# Patient Record
Sex: Male | Born: 1970 | Race: White | Hispanic: No | Marital: Married | Smoking: Never smoker
Health system: Southern US, Community
[De-identification: ages and names within clinical notes are randomized; demographics above are authoritative.]

## PROBLEM LIST (undated history)

## (undated) HISTORY — PX: OTHER SURGICAL HISTORY: SHX169

## (undated) HISTORY — PX: CHOLECYSTECTOMY: SHX55

## (undated) HISTORY — PX: TONSILLECTOMY: SUR1361

---

## 2016-07-20 ENCOUNTER — Emergency Department (HOSPITAL_COMMUNITY)
Admission: EM | Admit: 2016-07-20 | Discharge: 2016-07-21 | Disposition: A | Discharge status: Home / Self Care | Payer: 59 | Attending: Emergency Medicine | Admitting: Emergency Medicine

## 2016-07-20 ENCOUNTER — Encounter (HOSPITAL_COMMUNITY): Payer: Self-pay | Admitting: Emergency Medicine

## 2016-07-20 ENCOUNTER — Emergency Department (HOSPITAL_COMMUNITY): Payer: 59

## 2016-07-20 DIAGNOSIS — Y999 Unspecified external cause status: Secondary | ICD-10-CM | POA: Diagnosis not present

## 2016-07-20 DIAGNOSIS — Y93F2 Activity, caregiving, lifting: Secondary | ICD-10-CM | POA: Insufficient documentation

## 2016-07-20 DIAGNOSIS — M25572 Pain in left ankle and joints of left foot: Secondary | ICD-10-CM

## 2016-07-20 DIAGNOSIS — X501XXA Overexertion from prolonged static or awkward postures, initial encounter: Secondary | ICD-10-CM | POA: Diagnosis not present

## 2016-07-20 DIAGNOSIS — Y929 Unspecified place or not applicable: Secondary | ICD-10-CM | POA: Diagnosis not present

## 2016-07-20 MED ORDER — IBUPROFEN 400 MG PO TABS
600.0000 mg | ORAL_TABLET | Freq: Once | ORAL | Status: AC
  Administered 2016-07-21: 600 mg via ORAL
  Filled 2016-07-20: qty 2

## 2016-07-20 MED ORDER — OXYCODONE-ACETAMINOPHEN 5-325 MG PO TABS
2.0000 | ORAL_TABLET | Freq: Once | ORAL | Status: AC
  Administered 2016-07-21: 2 via ORAL
  Filled 2016-07-20: qty 2

## 2016-07-20 NOTE — ED Triage Notes (Signed)
Pt states he had squatted down to pick something up and felt a pop in his L leg just above the ankle. Pt reports throbbing pain and difficulty walking.

## 2016-07-20 NOTE — ED Provider Notes (Signed)
[facility]-EM[facility]Y DEPT Provider Note   CSN: [id number] Arrival date & time: 07/20/16  1941  By signing my name below, I, Linna Darner, attest that this documentation has been prepared under the direction and in the presence of physician practitioner, Azalia Bilis, MD. Electronically Signed: Linna Darner, Scribe. 07/20/2016. 11:42 PM.  History   Chief Complaint Chief Complaint  Patient presents with  . Leg Pain    The history is provided by the patient. No language interpreter was used.     HPI Comments: Jay Morgan is a 45 y.o. male who presents to the Emergency Department complaining of sudden onset, constant, left lower leg pain beginning shortly PTA. Pt reports he squatted down to lift a heavy item and heard a popping sensation in his left lower leg right above his ankle. Pt notes some bruising and swelling to the area. He endorses shooting pain into his left ankle with left knee flexion. He reports he is able to wiggle his left toes. He has not tried any medications or treatments for pain PTA. He notes he is from [place] but is in town temporarily for work. Pt denies left knee pain, numbness, neuro deficits, or any other associated symptoms.  History reviewed. No pertinent past medical history.  There are no active problems to display for this patient.   Past Surgical History:  Procedure Laterality Date  . CHOLECYSTECTOMY    . TONSILLECTOMY    . tubes in ears         Home Medications    Prior to Admission medications   Not on File    Family History Family History  Problem Relation Age of Onset  . Hypertension Father   . Diabetes Other   . Hypertension Other     Social History Social History  Substance Use Topics  . Smoking status: Never Smoker  . Smokeless tobacco: Never Used  . Alcohol use No     Allergies   Review of patient's allergies indicates no known allergies.   Review of Systems Review of Systems  A complete 10 system review of systems  was obtained and all systems are negative except as noted in the HPI and PMH.   Physical Exam Updated Vital Signs BP 142/80 (BP Location: Left Arm)   Pulse 70   Temp 99 F (37.2 C) (Oral)   Resp 20   Ht 5\' 9"  (1.753 m)   Wt 240 lb (108.9 kg)   SpO2 100%   BMI 35.44 kg/m   Physical Exam  Constitutional: He is oriented to person, place, and time. He appears well-developed and well-nourished.  HENT:  Head: Normocephalic.  Eyes: EOM are normal.  Neck: Normal range of motion.  Pulmonary/Chest: Effort normal.  Abdominal: He exhibits no distension.  Musculoskeletal: Normal range of motion.  Painful ROM of left ankle with tenderness over the distal anterior tibia. Normal PT and DP pulse in left foot. Compartments of left foot are soft. Small associated swelling of the dorsum of the left foot.   Neurological: He is alert and oriented to person, place, and time.  Psychiatric: He has a normal mood and affect.  Nursing note and vitals reviewed.   ED Treatments / Results  Labs (all labs ordered are listed, but only abnormal results are displayed) Labs Reviewed - No data to display  EKG  EKG Interpretation None       Radiology Dg Tibia/fibula Left  Result Date: 07/20/2016 CLINICAL DATA:  The patient felt a pop with onset of pain  in the left leg just above the ankle when squatting to pick something up today. Initial encounter. EXAM: LEFT TIBIA AND FIBULA - 2 VIEW COMPARISON:  None. FINDINGS: There is no evidence of fracture or other focal bone lesions. Soft tissues are unremarkable. IMPRESSION: Negative exam. Electronically Signed   By: Drusilla Kannerhomas  Dalessio M.D.   On: 07/20/2016 20:46   Dg Ankle Complete Left  Result Date: 07/20/2016 CLINICAL DATA:  The patient felt a pop with onset of pain in the left leg just above the ankle when squatting to pick something up today. Initial encounter. EXAM: LEFT ANKLE COMPLETE - 3+ VIEW COMPARISON:  None. FINDINGS: There is no evidence of fracture,  dislocation, or joint effusion. There is no evidence of arthropathy or other focal bone abnormality. Soft tissues are unremarkable. IMPRESSION: Negative exam. Electronically Signed   By: Drusilla Kannerhomas  Dalessio M.D.   On: 07/20/2016 20:47    Procedures Procedures (including critical care time)  DIAGNOSTIC STUDIES: Oxygen Saturation is 100% on RA, normal by my interpretation.    COORDINATION OF CARE: 11:48 PM Discussed treatment plan with pt at bedside and pt agreed to plan.  Medications Ordered in ED Medications  oxyCODONE-acetaminophen (PERCOCET/ROXICET) 5-325 MG per tablet 2 tablet (not administered)  ibuprofen (ADVIL,MOTRIN) tablet 600 mg (not administered)     Initial Impression / Assessment and Plan / ED Course  I have reviewed the triage vital signs and the nursing notes.  Pertinent labs & imaging results that were available during my care of the patient were reviewed by me and considered in my medical decision making (see chart for details).  Clinical Course   Concerning for ligmant/tendon injury. Normal pulses. CAM walker, NRB, crutches. Orthopedic follow up. Will likely need MRI   I personally performed the services described in this documentation, which was scribed in my presence. The recorded information has been reviewed and is accurate.      Final Clinical Impressions(s) / ED Diagnoses   Final diagnoses:  Left ankle pain    New Prescriptions New Prescriptions   IBUPROFEN (ADVIL,MOTRIN) 600 MG TABLET    Take 1 tablet (600 mg total) by mouth every 8 (eight) hours as needed.   OXYCODONE-ACETAMINOPHEN (PERCOCET/ROXICET) 5-325 MG TABLET    Take 1 tablet by mouth every 4 (four) hours as needed for severe pain.     Azalia BilisKevin Violett Hobbs, MD 07/21/16 [phone]

## 2016-07-21 MED ORDER — OXYCODONE-ACETAMINOPHEN 5-325 MG PO TABS: 1.0000 | ORAL_TABLET | ORAL | 0 refills | Status: AC | PRN

## 2016-07-21 MED ORDER — IBUPROFEN 600 MG PO TABS: 600.0000 mg | ORAL_TABLET | Freq: Three times a day (TID) | ORAL | 0 refills | Status: AC | PRN

## 2016-07-21 NOTE — ED Notes (Signed)
Ice pack applied to left lower extremity. 

## 2016-07-21 NOTE — ED Notes (Signed)
Pt left ED ambulatory with cam walker and crutches with no signs of distress. Pt verbalized discharge instructions.

## 2016-07-22 ENCOUNTER — Encounter: Payer: Self-pay | Admitting: Orthopaedic Surgery

## 2016-07-22 ENCOUNTER — Ambulatory Visit (INDEPENDENT_AMBULATORY_CARE_PROVIDER_SITE_OTHER): Payer: 59 | Admitting: Orthopaedic Surgery

## 2016-07-22 VITALS — BP 139/81 | HR 62 | Temp 98.1°F | Ht 66.5 in | Wt 253.6 lb

## 2016-07-22 DIAGNOSIS — S96912A Strain of unspecified muscle and tendon at ankle and foot level, left foot, initial encounter: Secondary | ICD-10-CM

## 2016-07-22 NOTE — Patient Instructions (Addendum)
Wear the CAM walker, use ice, stay off of it as much as possible. Do contrast baths.    Contrast Bath  Prepare the baths.  Cold = 55-65 degrees     Hot = 105-110 degrees  Starting with the hot, dip hand or foot all the way into the water and hold there for selected duration.  Preferably 3 minutes.  After selected duration is up, dip hand or foot into the cold for 1/3 duration of the hot. (3 minutes hot, 1 minute cold)  Alternate back and forth for the times indicated for no more than a total of 20 minutes ending with hot.

## 2016-07-22 NOTE — Progress Notes (Signed)
Subjective:  I hurt my left ankle    Patient ID: Jay Morgan, male    DOB: 09/17/1971, 45 y.o.   MRN: [id number]  HPI He was lifting a heavy object on 07-20-16.  His left ankle twisted and he felt and heard a loud pop.  He had lateral ankle pain.  He could hardly walk. He developed swelling.  He went to the ER and had x-rays which were negative. He was given a CAM walker.  He is better now but has swelling and pain.  He has no other injury.  He has no crutches. He went to work today and has some more pain.   Review of Systems  HENT: Negative for congestion.   Respiratory: Negative for cough and shortness of breath.   Cardiovascular: Negative for chest pain and leg swelling.  Endocrine: Positive for cold intolerance.  Musculoskeletal: Positive for arthralgias, gait problem and joint swelling.  Allergic/Immunologic: Negative for environmental allergies.   History reviewed. No pertinent past medical history.  Past Surgical History:  Procedure Laterality Date  . CHOLECYSTECTOMY    . TONSILLECTOMY    . tubes in ears      Current Outpatient Prescriptions on File Prior to Visit  Medication Sig Dispense Refill  . ibuprofen (ADVIL,MOTRIN) 600 MG tablet Take 1 tablet (600 mg total) by mouth every 8 (eight) hours as needed. 15 tablet 0  . oxyCODONE-acetaminophen (PERCOCET/ROXICET) 5-325 MG tablet Take 1 tablet by mouth every 4 (four) hours as needed for severe pain. 15 tablet 0   No current facility-administered medications on file prior to visit.     Social History   Social History  . Marital status: Married    Spouse name: N/A  . Number of children: N/A  . Years of education: N/A   Occupational History  . Not on file.   Social History Main Topics  . Smoking status: Never Smoker  . Smokeless tobacco: Never Used  . Alcohol use No  . Drug use: No  . Sexual activity: Not on file   Other Topics Concern  . Not on file   Social History Narrative  . No narrative on file     Family History  Problem Relation Age of Onset  . Hypertension Father   . Diabetes Other   . Hypertension Other     BP 139/81   Pulse 62   Temp 98.1 F (36.7 C)   Ht 5' 6.5" (1.689 m)   Wt 253 lb 9.6 oz (115 kg)   BMI 40.32 kg/m      Objective:   Physical Exam  Constitutional: He is oriented to person, place, and time. He appears well-developed and well-nourished.  HENT:  Head: Normocephalic and atraumatic.  Eyes: Conjunctivae and EOM are normal. Pupils are equal, round, and reactive to light.  Neck: Normal range of motion. Neck supple.  Cardiovascular: Normal rate, regular rhythm and intact distal pulses.   Pulmonary/Chest: Effort normal.  Abdominal: Soft.  Musculoskeletal: He exhibits tenderness (Pain left ankle, swelling laterally, no ecchymosis, decreased motion, swelling of foot, NV intact.  Right ankle negative.).  Neurological: He is alert and oriented to person, place, and time. He has normal reflexes. He displays normal reflexes. No cranial nerve deficit. He exhibits normal muscle tone. Coordination normal.  Skin: Skin is warm and dry.  Psychiatric: He has a normal mood and affect. His behavior is normal. Judgment and thought content normal.          Assessment & Plan:  Encounter Diagnosis  Name Primary?  . Ankle strainMarland Kitchen, left, initial encounter Yes   Contrast bath instructions given.  Wear the CAM walker  Return in two weeks.  Call if any problem.  Precautions discussed. This will take three to four weeks to resolve.  Be patient.  Electronically Signed Darreld McleanWayne Khameron Gruenwald, MD 9/26/20172:57 PM

## 2016-08-05 ENCOUNTER — Ambulatory Visit: Payer: 59 | Admitting: Orthopaedic Surgery

## 2016-08-06 ENCOUNTER — Ambulatory Visit: Payer: 59 | Admitting: Orthopaedic Surgery

## 2016-08-13 ENCOUNTER — Ambulatory Visit: Payer: 59 | Admitting: Orthopaedic Surgery

## 2016-08-14 ENCOUNTER — Encounter: Payer: Self-pay | Admitting: Orthopaedic Surgery

## 2016-08-14 ENCOUNTER — Ambulatory Visit (INDEPENDENT_AMBULATORY_CARE_PROVIDER_SITE_OTHER): Payer: 59 | Admitting: Orthopaedic Surgery

## 2016-08-14 VITALS — BP 138/84 | HR 60 | Temp 97.7°F | Ht 66.5 in | Wt 255.0 lb

## 2016-08-14 DIAGNOSIS — S96912D Strain of unspecified muscle and tendon at ankle and foot level, left foot, subsequent encounter: Secondary | ICD-10-CM

## 2016-08-14 NOTE — Progress Notes (Signed)
Patient VH:QIONGEX:Jay Morgan, male DOB:07/06/1971, 45 y.o. [id number]  Chief Complaint  Patient presents with  . Follow-up    left ankle    HPI  Jay Morgan is a 45 y.o. male who has left ankle strain.  He has been in CAM walker.  He is improved and has less pain.  He has more motion.  He has no new trauma. HPI  Body mass index is 40.54 kg/m.  ROS  Review of Systems  HENT: Negative for congestion.   Respiratory: Negative for cough and shortness of breath.   Cardiovascular: Negative for chest pain and leg swelling.  Endocrine: Positive for cold intolerance.  Musculoskeletal: Positive for arthralgias, gait problem and joint swelling.  Allergic/Immunologic: Negative for environmental allergies.    No past medical history on file.  Past Surgical History:  Procedure Laterality Date  . CHOLECYSTECTOMY    . TONSILLECTOMY    . tubes in ears      Family History  Problem Relation Age of Onset  . Hypertension Father   . Diabetes Other   . Hypertension Other     Social History Social History  Substance Use Topics  . Smoking status: Never Smoker  . Smokeless tobacco: Never Used  . Alcohol use No    No Known Allergies  Current Outpatient Prescriptions  Medication Sig Dispense Refill  . ibuprofen (ADVIL,MOTRIN) 600 MG tablet Take 1 tablet (600 mg total) by mouth every 8 (eight) hours as needed. 15 tablet 0  . oxyCODONE-acetaminophen (PERCOCET/ROXICET) 5-325 MG tablet Take 1 tablet by mouth every 4 (four) hours as needed for severe pain. 15 tablet 0   No current facility-administered medications for this visit.      Physical Exam  Blood pressure 138/84, pulse 60, temperature 97.7 F (36.5 C), height 5' 6.5" (1.689 m), weight 255 lb (115.7 kg).  Constitutional: overall normal hygiene, normal nutrition, well developed, normal grooming, normal body habitus. Assistive device:CAM walker  Musculoskeletal: gait and station Limp left, muscle tone and strength are normal,  no tremors or atrophy is present.  .  Neurological: coordination overall normal.  Deep tendon reflex/nerve stretch intact.  Sensation normal.  Cranial nerves II-XII intact.   Skin:   Normal overall no scars, lesions, ulcers or rashes. No psoriasis.  Psychiatric: Alert and oriented x 3.  Recent memory intact, remote memory unclear.  Normal mood and affect. Well groomed.  Good eye contact.  Cardiovascular: overall no swelling, no varicosities, no edema bilaterally, normal temperatures of the legs and arms, no clubbing, cyanosis and good capillary refill.  Lymphatic: palpation is normal.  Left ankle with decreased motion.  NV intact. Lateral swelling but much less than last time.  Resolving ecchymosis present.  The patient has been educated about the nature of the problem(s) and counseled on treatment options.  The patient appeared to understand what I have discussed and is in agreement with it.  Encounter Diagnosis  Name Primary?  Marland Kitchen. Ankle strain, left, subsequent encounter Yes    PLAN Call if any problems.  Precautions discussed.  Continue current medications.   Return to clinic 2 weeks   Electronically Signed Darreld McleanWayne Shakia Sebastiano, MD 10/19/20173:27 PM

## 2016-08-26 ENCOUNTER — Encounter: Payer: Self-pay | Admitting: Orthopaedic Surgery

## 2016-08-26 ENCOUNTER — Ambulatory Visit (INDEPENDENT_AMBULATORY_CARE_PROVIDER_SITE_OTHER): Payer: 59 | Admitting: Orthopaedic Surgery

## 2016-08-26 VITALS — BP 142/86 | HR 67 | Temp 97.7°F | Ht 66.5 in | Wt 258.0 lb

## 2016-08-26 DIAGNOSIS — S96912D Strain of unspecified muscle and tendon at ankle and foot level, left foot, subsequent encounter: Secondary | ICD-10-CM | POA: Diagnosis not present

## 2016-08-26 NOTE — Progress Notes (Signed)
Patient Jay Morgan:Jay Morgan, male DOB:05/24/1971, 45 y.o. WUJ:[id number]  Chief Complaint  Patient presents with  . Follow-up    left ankle strain    HPI  Jay Morgan is a 45 y.o. male who has had left ankle pain. He was doing well until Saturday when he "stretched" a lot.  He has had more pain in the ankle.  He has swelling more medially now.  He has resumed a CAM walker.  He has no other trauma.  He has no other joint pain.  I will have him begin PT for the ankle.  He is agreeable to this. HPI  Body mass index is 41.02 kg/m.  ROS  Review of Systems  HENT: Negative for congestion.   Respiratory: Negative for cough and shortness of breath.   Cardiovascular: Negative for chest pain and leg swelling.  Endocrine: Positive for cold intolerance.  Musculoskeletal: Positive for arthralgias, gait problem and joint swelling.  Allergic/Immunologic: Negative for environmental allergies.    No past medical history on file.  Past Surgical History:  Procedure Laterality Date  . CHOLECYSTECTOMY    . TONSILLECTOMY    . tubes in ears      Family History  Problem Relation Age of Onset  . Hypertension Father   . Diabetes Other   . Hypertension Other     Social History Social History  Substance Use Topics  . Smoking status: Never Smoker  . Smokeless tobacco: Never Used  . Alcohol use No    No Known Allergies  Current Outpatient Prescriptions  Medication Sig Dispense Refill  . ibuprofen (ADVIL,MOTRIN) 600 MG tablet Take 1 tablet (600 mg total) by mouth every 8 (eight) hours as needed. 15 tablet 0  . oxyCODONE-acetaminophen (PERCOCET/ROXICET) 5-325 MG tablet Take 1 tablet by mouth every 4 (four) hours as needed for severe pain. 15 tablet 0   No current facility-administered medications for this visit.      Physical Exam  Blood pressure (!) 142/86, pulse 67, temperature 97.7 F (36.5 C), height 5' 6.5" (1.689 m), weight 258 lb (117 kg).  Constitutional: overall normal  hygiene, normal nutrition, well developed, normal grooming, normal body habitus. Assistive device:CAM walker  Musculoskeletal: gait and station Limp left, muscle tone and strength are normal, no tremors or atrophy is present.  .  Neurological: coordination overall normal.  Deep tendon reflex/nerve stretch intact.  Sensation normal.  Cranial nerves II-XII intact.   Skin:   Normal overall no scars, lesions, ulcers or rashes. No psoriasis.  Psychiatric: Alert and oriented x 3.  Recent memory intact, remote memory unclear.  Normal mood and affect. Well groomed.  Good eye contact.  Cardiovascular: overall no swelling, no varicosities, no edema bilaterally, normal temperatures of the legs and arms, no clubbing, cyanosis and good capillary refill.  Lymphatic: palpation is normal.  His left ankle has swelling more medially and more pain here.  He has no redness.  NV intact.  Motion is full. He has limp to the left.  The patient has been educated about the nature of the problem(s) and counseled on treatment options.  The patient appeared to understand what I have discussed and is in agreement with it.  Encounter Diagnosis  Name Primary?  Marland Kitchen. Ankle strain, left, subsequent encounter Yes    PLAN Call if any problems.  Precautions discussed.  Continue current medications.   Return to clinic 3 weeks   Begin PT.  Electronically Signed Darreld McleanWayne Mayrani Khamis, MD 10/31/20174:37 PM

## 2016-08-27 ENCOUNTER — Ambulatory Visit: Payer: 59 | Attending: Orthopaedic Surgery | Admitting: Physical Therapy

## 2016-08-27 DIAGNOSIS — M25572 Pain in left ankle and joints of left foot: Secondary | ICD-10-CM | POA: Diagnosis not present

## 2016-08-27 DIAGNOSIS — M25672 Stiffness of left ankle, not elsewhere classified: Secondary | ICD-10-CM | POA: Insufficient documentation

## 2016-08-27 DIAGNOSIS — R2689 Other abnormalities of gait and mobility: Secondary | ICD-10-CM | POA: Diagnosis present

## 2016-08-27 NOTE — Therapy (Signed)
[facility] [address] [place], [place], [address] Phone: [phone]   Fax:  [phone]  Physical Therapy Evaluation  Patient Details  Name: Jay MaduraDempsey Gervin MRN: [id number] Date of Birth: 12/04/1970 Referring Provider: Darreld McleanWayne Keeling  Encounter Date: 08/27/2016      PT End of Session - 08/27/16 1513    Visit Number 1   Number of Visits 12   Date for PT Re-Evaluation 10/08/16   PT Start Time 1440   PT Stop Time 1509   PT Time Calculation (min) 29 min   Activity Tolerance Patient tolerated treatment well   Behavior During Therapy [facility] for tasks assessed/performed      No past medical history on file.  Past Surgical History:  Procedure Laterality Date  . CHOLECYSTECTOMY    . TONSILLECTOMY    . tubes in ears      There were no vitals filed for this visit.       Subjective Assessment - 08/27/16 1442    Subjective Pt is a 45 y/o male who presents to OPPT s/p L ankle sprain (reports he had loud "pop" but doesn't recall any rolling) on 07/20/16.  Pt reports continued pain and difficulty with mobility since.  Presents today with CAM boot on.   Limitations Walking;Standing   How long can you walk comfortably? hours in boot; does minimal walking out of boot   Patient Stated Goals improve pain, return to normal function, eliminate boot   Currently in Pain? Yes   Pain Score 0-No pain  up to 4/10   Pain Location Ankle   Pain Orientation Left   Pain Descriptors / Indicators Sore;Burning;Throbbing   Pain Type Acute pain  subacute   Pain Onset 1 to 4 weeks ago   Pain Frequency Intermittent   Aggravating Factors  evenings, amb without boot   Pain Relieving Factors medication, boot            [facility] PT Assessment - 08/27/16 1447      Assessment   Medical Diagnosis L ankle sprain   Referring Provider Darreld McleanWayne Keeling   Onset Date/Surgical Date 07/20/16   Next MD Visit 09/16/16   Prior Therapy none     Precautions   Precautions None   Required Braces or Orthoses Other Brace/Splint   Other Brace/Splint CAM boot PRN     Restrictions   Weight Bearing Restrictions No     Balance Screen   Has the patient fallen in the past 6 months No   Has the patient had a decrease in activity level because of a fear of falling?  No   Is the patient reluctant to leave their home because of a fear of falling?  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 [employer]   Leisure hunting, fishing     Cognition   Overall Cognitive Status Within Functional Limits for tasks assessed     Functional Tests   Functional tests Single leg stance     Single Leg Stance   Comments LLE > 10  sec on level and compliant surface; pt c/o increased instability with compliant surface     AROM   AROM Assessment Site Ankle   Right/Left Ankle Left   Left Ankle Dorsiflexion 4   Left Ankle Plantar Flexion 49   Left Ankle Inversion 27   Left Ankle Eversion 15     PROM   PROM Assessment Site Ankle   Right/Left Ankle Left   Left Ankle Dorsiflexion 7   Left Ankle Plantar Flexion 54   Left Ankle Inversion 31   Left Ankle Eversion 20     Strength   Strength Assessment Site Ankle   Right/Left Ankle Left   Left Ankle Dorsiflexion 5/5   Left Ankle Plantar Flexion 2/5   Left Ankle Inversion 4-/5   Left Ankle Eversion 4/5     Ambulation/Gait   Gait Comments mild gait deviations consistent with amb with CAM boot on L                   [facility] Adult PT Treatment/Exercise - 08/27/16 1447      Self-Care   Self-Care Other  Self-Care Comments   Other Self-Care Comments  instructed in ankle AROM- pt performed 3-5 reps of each; pt demonstrated understanding; recommended decreasing use of CAM boot as tolerated and use of tennis shoe for indoors only.  Recommend CAM boot for community ambulation.     Exercises   Exercises Ankle     Ankle Exercises: Seated   ABC's 1 rep   Ankle Circles/Pumps 5 reps;Left  CW/CCW, pumps   Other Seated Ankle Exercises inversion/eversion x 5 reps each                PT Education - 08/27/16 1512    Education provided Yes   Education Details see self care    Person(s) Educated Patient   Methods Explanation;Handout;Demonstration   Comprehension Verbalized understanding             PT Long Term Goals - 08/27/16 1516      PT LONG TERM GOAL #1   Title independent with HEP (10/08/16)   Time 6   Period Weeks   Status New     PT LONG TERM GOAL #2   Title improve L ankle AROM plantarflexion to > 52 degrees without pain for improved function (10/08/16)   Time 6   Period Weeks   Status New     PT LONG TERM GOAL #3   Title demonstrate at least 3/5 plantarflexion strength for improved stability and function (10/08/16)   Time 6   Period Weeks   Status New     PT LONG TERM GOAL #4   Title negotiate stairs reciprocally without increase in pain for improved function (10/08/16)   Time 6   Period Weeks   Status New     PT LONG TERM GOAL #5   Title report ability to work full day without CAM boot without pain at end of day for improved function (10/08/16)   Time 6   Period Weeks   Status New               Plan - 08/27/16 1514    Clinical Impression Statement Pt is a 45 y/o male who presents to OPPT for low complexity PT eval of L ankle sprain.  Pt presents with pain, decreased strength and mild ROM deficits affecting functional mobility.  Will benefit from PT to maximize function and address deficits listed.   Rehab Potential Good  PT Frequency 2x /  week   PT Duration 6 weeks   PT Treatment/Interventions ADLs/Self Care Home Management;Cryotherapy;Moist Heat;Electrical Stimulation;Stair training;Gait training;Therapeutic activities;Functional mobility training;Patient/family education;Balance training;Therapeutic exercise;Orthotic Fit/Training;Manual techniques;Passive range of motion;Vasopneumatic Device   PT Next Visit Plan review HEP, compliant surface training, ROM/gastroc stretching, manual/modalities PRN   Consulted and Agree with Plan of Care Patient      Patient will benefit from skilled therapeutic intervention in order to improve the following deficits and impairments:  Abnormal gait, Pain, Decreased range of motion, Difficulty walking, Decreased strength  Visit Diagnosis: Pain in left ankle and joints of left foot - Plan: PT plan of care cert/re-cert  Other abnormalities of gait and mobility - Plan: PT plan of care cert/re-cert  Stiffness of left ankle, not elsewhere classified - Plan: PT plan of care cert/re-cert     Problem List There are no active problems to display for this patient.      Clarita CraneStephanie F Safire Gordin, PT, DPT 08/27/16 3:22 PM    [facility] [address] [place], [place], [address] Phone: [phone]   Fax:  [phone]  Name: Jay MaduraDempsey Scriven MRN: [id number] Date of Birth: 02/17/1971

## 2016-08-27 NOTE — Patient Instructions (Signed)
   ROM: Inversion / Eversion   With left leg relaxed, gently turn ankle and foot in and out. Move through full range of motion. Avoid pain. Repeat __20__ times per set. Do __1__ sets per session. Do __2-3__ sessions per day.  http://orth.exer.us/36   Copyright  VHI. All rights reserved.  ROM: Plantar / Dorsiflexion   With left leg relaxed, gently flex and extend ankle. Move through full range of motion. Avoid pain. Repeat _20___ times per set. Do _1___ sets per session. Do _2-3___ sessions per day.  http://orth.exer.us/34   Copyright  VHI. All rights reserved.  Ankle Alphabet   Using left ankle and foot only, trace the letters of the alphabet. Perform A to Z. Repeat __1-2__ times per set. Do _1___ sets per session. Do _2-3___ sessions per day.  http://orth.exer.us/16   Copyright  VHI. All rights reserved.   Ankle Circles   Slowly rotate left foot and ankle clockwise then counterclockwise. Gradually increase range of motion. Avoid pain. Circle __20__ times each direction per set. Do ___1_ sets per session. Do _2-3___ sessions per day.  http://orth.exer.us/30   Copyright  VHI. All rights reserved.

## 2016-09-01 ENCOUNTER — Ambulatory Visit: Payer: 59 | Admitting: Physical Therapy

## 2016-09-02 ENCOUNTER — Ambulatory Visit: Payer: 59 | Admitting: Physical Therapy

## 2016-09-02 DIAGNOSIS — R2689 Other abnormalities of gait and mobility: Secondary | ICD-10-CM

## 2016-09-02 DIAGNOSIS — M25672 Stiffness of left ankle, not elsewhere classified: Secondary | ICD-10-CM

## 2016-09-02 DIAGNOSIS — M25572 Pain in left ankle and joints of left foot: Secondary | ICD-10-CM | POA: Diagnosis not present

## 2016-09-03 NOTE — Therapy (Signed)
[facility] [address] [place], [place], [address] Phone: [phone]   Fax:  [phone]  Physical Therapy Treatment  Patient Details  Name: Jay Morgan MRN: [id number] Date of Birth: 09/05/1971 Referring Provider: Darreld McleanWayne Keeling  Encounter Date: 09/02/2016      PT End of Session - 09/03/16 0732    Visit Number 2   Number of Visits 12   Date for PT Re-Evaluation 10/08/16   PT Start Time 0401   PT Stop Time 0450   PT Time Calculation (min) 49 min   Activity Tolerance Patient tolerated treatment well   Behavior [facility] for tasks assessed/performed      No past medical history on file.  Past Surgical History:  Procedure Laterality Date  . CHOLECYSTECTOMY    . TONSILLECTOMY    . tubes in ears      There were no vitals filed for this visit.      Subjective Assessment - 09/03/16 0733    Subjective I stopped using that boot.   Pain Score 1    Pain Location Ankle   Pain Orientation Left   Pain Descriptors / Indicators Sore;Burning;Throbbing   Pain Type Acute pain   Pain Onset 1 to 4 weeks ago     Treatment:  PROM to patient left ankle f/b pre-mod e'stim x 20 minutes to left ATFL region and syndesmosis region.  Patient did great today.  Begin theraband for strengthening.                                 PT Long Term Goals - 08/27/16 1516      PT LONG TERM GOAL #1   Title independent with HEP (10/08/16)   Time 6   Period Weeks   Status New     PT LONG TERM GOAL #2   Title improve L ankle AROM plantarflexion to > 52 degrees without pain for improved function (10/08/16)   Time 6   Period Weeks   Status New     PT LONG TERM GOAL #3   Title demonstrate at least 3/5 plantarflexion strength for improved stability and function (10/08/16)   Time 6   Period Weeks   Status New     PT LONG TERM GOAL #4   Title negotiate stairs reciprocally without increase in pain for improved  function (10/08/16)   Time 6   Period Weeks   Status New     PT LONG TERM GOAL #5   Title report ability to work full day without CAM boot without pain at end of day for improved function (10/08/16)   Time 6   Period Weeks   Status New             Patient will benefit from skilled therapeutic intervention in order to improve the following deficits and impairments:  Abnormal gait, Pain, Decreased range of motion, Difficulty walking, Decreased strength  Visit Diagnosis: Pain in left ankle and joints of left foot  Other abnormalities of gait and mobility  Stiffness of left ankle, not elsewhere classified     Problem List There are no active problems to display for this patient.   Jay Morgan, Jay Morgan 09/03/2016, 7:38 AM  [facility] [address] [place], [place], [address] Phone: [phone]   Fax:  [phone]  Name: Jay Morgan MRN: [id number] Date of Birth: 11/12/1970

## 2016-09-04 ENCOUNTER — Ambulatory Visit: Payer: 59 | Admitting: *Deleted

## 2016-09-04 DIAGNOSIS — M25572 Pain in left ankle and joints of left foot: Secondary | ICD-10-CM

## 2016-09-04 DIAGNOSIS — M25672 Stiffness of left ankle, not elsewhere classified: Secondary | ICD-10-CM

## 2016-09-04 DIAGNOSIS — R2689 Other abnormalities of gait and mobility: Secondary | ICD-10-CM

## 2016-09-04 NOTE — Therapy (Signed)
[facility] [address] [place], [place], [address] Phone: [phone]   Fax:  [phone]  Physical Therapy Treatment  Patient Details  Name: Jay Morgan MRN: [id number] Date of Birth: 06/14/1971 Referring Provider: Darreld McleanWayne Keeling  Encounter Date: 09/04/2016      PT End of Session - 09/04/16 1615    Visit Number 3   Number of Visits 12   Date for PT Re-Evaluation 10/08/16   PT Start Time 1600   PT Stop Time 1651   PT Time Calculation (min) 51 min      No past medical history on file.  Past Surgical History:  Procedure Laterality Date  . CHOLECYSTECTOMY    . TONSILLECTOMY    . tubes in ears      There were no vitals filed for this visit.      Subjective Assessment - 09/04/16 1610    Subjective I stopped using that boot. I have been up a lot latley.  LT ankle is doing better. Feels weak.    Limitations Walking;Standing   How long can you walk comfortably? hours in boot; does minimal walking out of boot   Patient Stated Goals improve pain, return to normal function, eliminate boot   Currently in Pain? Yes   Pain Score 1    Pain Location Ankle   Pain Orientation Left   Pain Type Acute pain   Pain Onset 1 to 4 weeks ago   Pain Frequency Intermittent                         [facility] Adult PT Treatment/Exercise - 09/04/16 0001      Exercises   Exercises Ankle     Modalities   Modalities Electrical Stimulation;Vasopneumatic     Electrical Stimulation   Electrical Stimulation Location Premod x 15 mins 80-150hz    Electrical Stimulation Goals Pain     Vasopneumatic   Number Minutes Vasopneumatic  15 minutes   Vasopnuematic Location  Ankle   Vasopneumatic Pressure Low   Vasopneumatic Temperature  34     Manual Therapy   Manual Therapy Soft tissue mobilization;Passive ROM   Soft tissue mobilization gentle x friction mass to ATFL   Passive ROM PROM for DF/PF     Ankle Exercises: Seated   Heel  Slides Limitations Rocker board PF/DF  3x 20, Dyna disc all motions x5  mins                     PT Long Term Goals - 08/27/16 1516      PT LONG TERM GOAL #1   Title independent with HEP (10/08/16)   Time 6   Period Weeks   Status New     PT LONG TERM GOAL #2   Title improve L ankle AROM plantarflexion to > 52 degrees without pain for improved function (10/08/16)   Time 6   Period Weeks   Status New     PT LONG TERM GOAL #3   Title demonstrate at least 3/5 plantarflexion strength for improved stability and function (10/08/16)   Time 6   Period Weeks   Status New     PT LONG TERM GOAL #4   Title negotiate stairs reciprocally without increase in pain for improved function (10/08/16)   Time 6   Period Weeks   Status New     PT LONG TERM GOAL #5   Title report ability to work full day without CAM  boot without pain at end of day for improved function (10/08/16)   Time 6   Period Weeks   Status New               Plan - 09/04/16 1602    Clinical Impression Statement Pt did fairly well with Rx and was able to perform sitting AROM and proprioception exs with minimal pain increase. He still C/O pain with end-range DF and PF. Pt had normal response to modaiities.   Rehab Potential Good   PT Frequency 2x / week   PT Duration 6 weeks   PT Treatment/Interventions ADLs/Self Care Home Management;Cryotherapy;Moist Heat;Electrical Stimulation;Stair training;Gait training;Therapeutic activities;Functional mobility training;Patient/family education;Balance training;Therapeutic exercise;Orthotic Fit/Training;Manual techniques;Passive range of motion;Vasopneumatic Device   PT Next Visit Plan review HEP, compliant surface training, ROM/gastroc stretching, manual/modalities PRN   Consulted and Agree with Plan of Care Patient      Patient will benefit from skilled therapeutic intervention in order to improve the following deficits and impairments:  Abnormal gait, Pain,  Decreased range of motion, Difficulty walking, Decreased strength  Visit Diagnosis: Pain in left ankle and joints of left foot  Other abnormalities of gait and mobility  Stiffness of left ankle, not elsewhere classified     Problem List There are no active problems to display for this patient.   RAMSEUR,CHRIS, PTA 09/04/2016, 6:07 PM  [facility] [address] [place], [place], [address] Phone: [phone]   Fax:  [phone]  Name: Jay Morgan MRN: [id number] Date of Birth: 06/06/1971

## 2016-09-09 ENCOUNTER — Ambulatory Visit: Payer: 59 | Admitting: *Deleted

## 2016-09-11 ENCOUNTER — Ambulatory Visit: Payer: 59 | Admitting: *Deleted

## 2016-09-11 DIAGNOSIS — M25572 Pain in left ankle and joints of left foot: Secondary | ICD-10-CM

## 2016-09-11 DIAGNOSIS — M25672 Stiffness of left ankle, not elsewhere classified: Secondary | ICD-10-CM

## 2016-09-11 DIAGNOSIS — R2689 Other abnormalities of gait and mobility: Secondary | ICD-10-CM

## 2016-09-11 NOTE — Therapy (Signed)
[facility] [address] [place], [place], [address] Phone: [phone]   Fax:  [phone]  Physical Therapy Treatment  Patient Details  Name: Jay Morgan MRN: [id number] Date of Birth: 02/17/1971 Referring Provider: Darreld McleanWayne Keeling  Encounter Date: 09/11/2016      PT End of Session - 09/11/16 2212    Visit Number 4   Number of Visits 12   Date for PT Re-Evaluation 10/08/16   PT Start Time 1600   PT Stop Time 1649   PT Time Calculation (min) 49 min      No past medical history on file.  Past Surgical History:  Procedure Laterality Date  . CHOLECYSTECTOMY    . TONSILLECTOMY    . tubes in ears      There were no vitals filed for this visit.      Subjective Assessment - 09/11/16 1610    Subjective I stopped using that boot. I have been up a lot latley.  LT ankle is doing better. Feels weak.   1-2/ tenderness   Limitations Walking;Standing   How long can you walk comfortably? hours in boot; does minimal walking out of boot   Patient Stated Goals improve pain, return to normal function, eliminate boot   Currently in Pain? Yes   Pain Score 1    Pain Location Ankle   Pain Orientation Left   Pain Descriptors / Indicators Sore;Burning;Throbbing   Pain Type Acute pain   Pain Onset 1 to 4 weeks ago   Pain Frequency Intermittent                         [facility] Adult PT Treatment/Exercise - 09/11/16 0001      Exercises   Exercises --     Modalities   Modalities Electrical Stimulation;Vasopneumatic     Programme researcher, broadcasting/film/videolectrical Stimulation   Electrical Stimulation Location Premod x 15 mins 80-150hz    Electrical Stimulation Goals Pain     Vasopneumatic   Number Minutes Vasopneumatic  15 minutes   Vasopnuematic Location  Ankle   Vasopneumatic Pressure Low   Vasopneumatic Temperature  34     Manual Therapy   Manual Therapy Soft tissue mobilization;Passive ROM   Soft tissue mobilization gentle x friction mass to ATFL    Passive ROM PROM for DF/PF     Ankle Exercises: Standing   Other Standing Ankle Exercises dyna-disc standing all motions     Ankle Exercises: Seated   Heel Slides Limitations Rocker board PF/DF  3x 20, Dyna disc all motions x5  mins   Other Seated Ankle Exercises eversion  and DF yellow tband 3x10-20 HEP                     PT Long Term Goals - 08/27/16 1516      PT LONG TERM GOAL #1   Title independent with HEP (10/08/16)   Time 6   Period Weeks   Status New     PT LONG TERM GOAL #2   Title improve L ankle AROM plantarflexion to > 52 degrees without pain for improved function (10/08/16)   Time 6   Period Weeks   Status New     PT LONG TERM GOAL #3   Title demonstrate at least 3/5 plantarflexion strength for improved stability and function (10/08/16)   Time 6   Period Weeks   Status New     PT LONG TERM GOAL #4   Title negotiate stairs  reciprocally without increase in pain for improved function (10/08/16)   Time 6   Period Weeks   Status New     PT LONG TERM GOAL #5   Title report ability to work full day without CAM boot without pain at end of day for improved function (10/08/16)   Time 6   Period Weeks   Status New               Plan - 09/11/16 1627    Clinical Impression Statement Pt did great today and continues to improve with ROM and proprioception for LT ankle. He was not as tender around ATFL during x-friction massage and did well with HEP   Rehab Potential Good   PT Frequency 2x / week   PT Duration 6 weeks   PT Treatment/Interventions ADLs/Self Care Home Management;Cryotherapy;Moist Heat;Electrical Stimulation;Stair training;Gait training;Therapeutic activities;Functional mobility training;Patient/family education;Balance training;Therapeutic exercise;Orthotic Fit/Training;Manual techniques;Passive range of motion;Vasopneumatic Device   PT Next Visit Plan review HEP, compliant surface training, ROM/gastroc stretching,  manual/modalities PRN   Consulted and Agree with Plan of Care Patient      Patient will benefit from skilled therapeutic intervention in order to improve the following deficits and impairments:  Abnormal gait, Pain, Decreased range of motion, Difficulty walking, Decreased strength  Visit Diagnosis: Pain in left ankle and joints of left foot  Stiffness of left ankle, not elsewhere classified  Other abnormalities of gait and mobility     Problem List There are no active problems to display for this patient.   RAMSEUR,CHRIS, PTA 09/11/2016, 10:16 PM  [facility] [address] [place], [place], [address] Phone: [phone]   Fax:  [phone]  Name: Jay Morgan Date of Birth: 03/10/1971

## 2016-09-16 ENCOUNTER — Ambulatory Visit: Payer: 59 | Admitting: *Deleted

## 2016-09-16 ENCOUNTER — Ambulatory Visit: Payer: 59 | Admitting: Orthopaedic Surgery

## 2016-09-16 DIAGNOSIS — M25572 Pain in left ankle and joints of left foot: Secondary | ICD-10-CM | POA: Diagnosis not present

## 2016-09-16 DIAGNOSIS — R2689 Other abnormalities of gait and mobility: Secondary | ICD-10-CM

## 2016-09-16 DIAGNOSIS — M25672 Stiffness of left ankle, not elsewhere classified: Secondary | ICD-10-CM

## 2016-09-16 NOTE — Therapy (Addendum)
[facility] [place], [place], [address] Phone: [phone]   Fax:  [phone]  Physical Therapy Treatment  Patient Details  Name: Jay Morgan MRN: [id number] Date of Birth: Jun 17, 1971 Referring Provider: Sanjuana Kava  Encounter Date: 09/16/2016    No past medical history on file.  Past Surgical History:  Procedure Laterality Date  . CHOLECYSTECTOMY    . TONSILLECTOMY    . tubes in ears      There were no vitals filed for this visit.                                    PT Long Term Goals - 08/27/16 1516      PT LONG TERM GOAL #1   Title independent with HEP (10/08/16)   Time 6   Period Weeks   Status New     PT LONG TERM GOAL #2   Title improve L ankle AROM plantarflexion to > 52 degrees without pain for improved function (10/08/16)   Time 6   Period Weeks   Status New     PT LONG TERM GOAL #3   Title demonstrate at least 3/5 plantarflexion strength for improved stability and function (10/08/16)   Time 6   Period Weeks   Status New     PT LONG TERM GOAL #4   Title negotiate stairs reciprocally without increase in pain for improved function (10/08/16)   Time 6   Period Weeks   Status New     PT LONG TERM GOAL #5   Title report ability to work full day without CAM boot without pain at end of day for improved function (10/08/16)   Time 6   Period Weeks   Status New             Patient will benefit from skilled therapeutic intervention in order to improve the following deficits and impairments:  Abnormal gait, Pain, Decreased range of motion, Difficulty walking, Decreased strength  Visit Diagnosis: Pain in left ankle and joints of left foot  Stiffness of left ankle, not elsewhere classified  Other abnormalities of gait and mobility     Problem List There are no active problems to display for this patient.   APPLEGATE, [place], PTA 07/10/2017, 3:23  PM  [facility] [address] [place], [place], [address] Phone: [phone]   Fax:  [phone]  Name: Jay Morgan MRN: [id number] Date of Birth: 04-18-1971  PHYSICAL THERAPY DISCHARGE SUMMARY  Visits from Start of Care: 5.  Current functional level related to goals / functional outcomes: See above.   Remaining deficits: See goal section.   Education / Equipment: HEP. Plan: Patient agrees to discharge.  Patient goals were not met. Patient is being discharged due to not returning since the last visit.  ?????          Mali Applegate MPT

## 2016-09-23 ENCOUNTER — Ambulatory Visit: Payer: 59 | Admitting: Physical Therapy

## 2016-09-25 ENCOUNTER — Ambulatory Visit: Payer: 59 | Admitting: Physical Therapy

## 2017-08-05 IMAGING — DX DG TIBIA/FIBULA 2V*L*
4 series · 4 of 4 positions shown · non-contrast
Comparison: None.

CLINICAL DATA: The patient felt a pop with onset of pain in the
left leg just above the ankle when squatting to pick something up
today. Initial encounter.

EXAM:
LEFT TIBIA AND FIBULA - 2 VIEW

[tibia ap (1 of 2)]
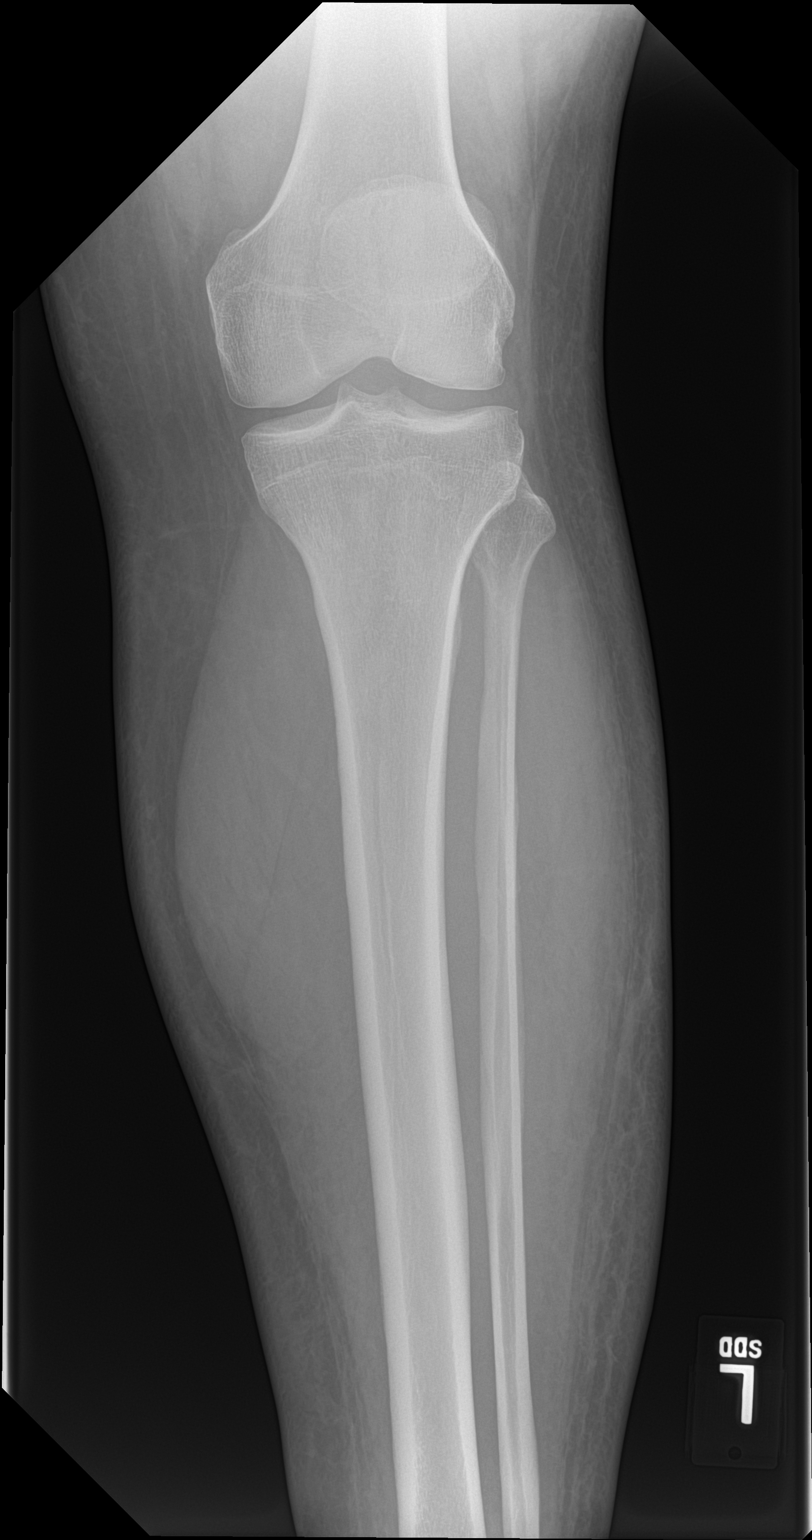

[tibia ap (2 of 2)]
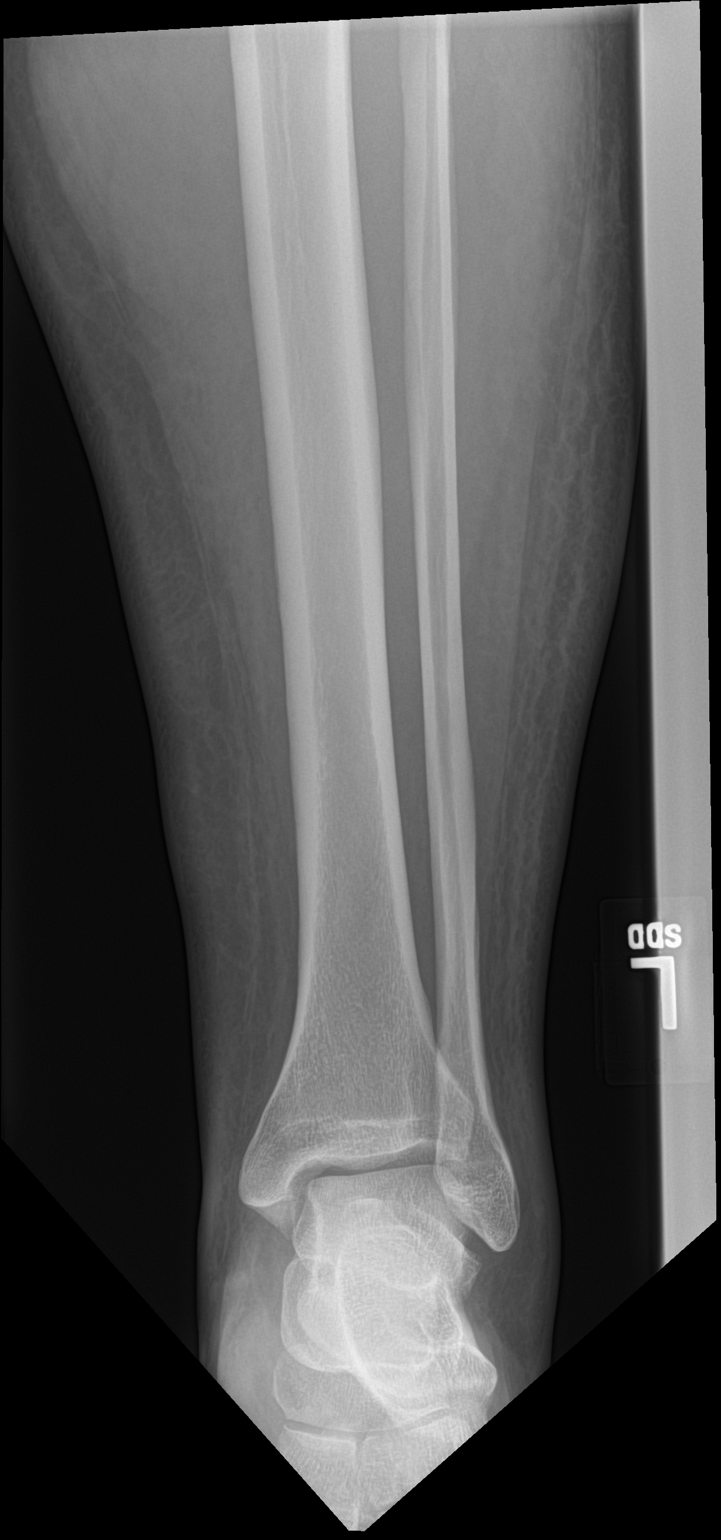

[tibia lat (1 of 2)]
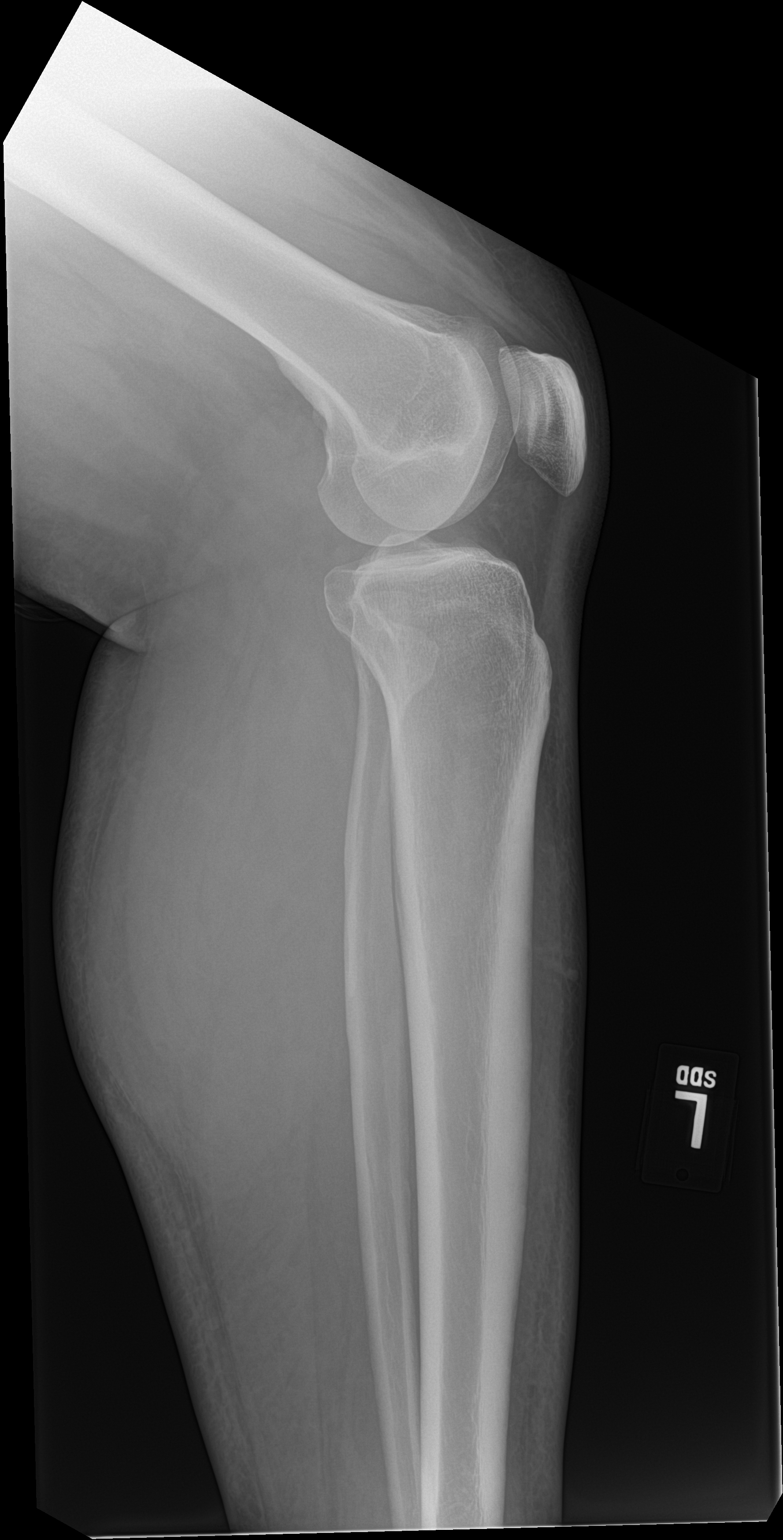

[tibia lat (2 of 2)]
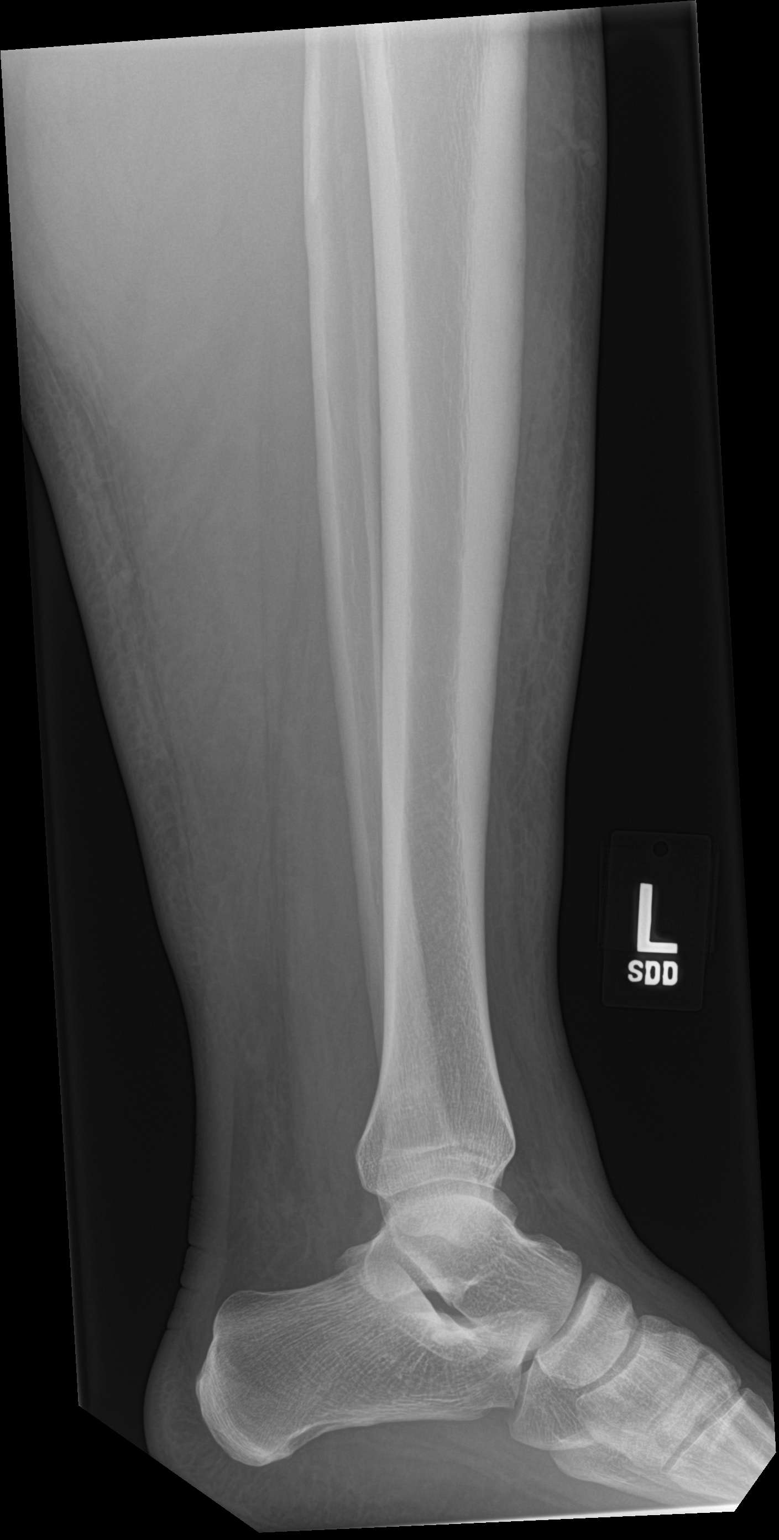

[4 of 4 positions shown; findings below may reference images not displayed]

FINDINGS: There is no evidence of fracture or other focal bone lesions. Soft
tissues are unremarkable.
IMPRESSION: Negative exam.
